# Patient Record
Sex: Female | Born: 2000 | Hispanic: Yes | Marital: Single
Health system: Southern US, Community
[De-identification: ages and names within clinical notes are randomized; demographics above are authoritative.]

---
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
## 2023-02-28 ENCOUNTER — Other Ambulatory Visit
Admission: RE | Admit: 2023-02-28 | Discharge: 2023-02-28 | Disposition: A | Discharge status: Home / Self Care | Payer: BC Managed Care – PPO | Attending: Nurse Practitioner | Admitting: Nurse Practitioner

## 2023-02-28 DIAGNOSIS — E559 Vitamin D deficiency, unspecified: Secondary | ICD-10-CM | POA: Insufficient documentation

## 2023-02-28 DIAGNOSIS — Z0000 Encounter for general adult medical examination without abnormal findings: Secondary | ICD-10-CM | POA: Diagnosis not present

## 2023-02-28 LAB — CBC WITH DIFFERENTIAL/PLATELET
Abs Immature Granulocytes: 0.02 10*3/uL (ref 0.00–0.07)
Basophils Absolute: 0.1 10*3/uL (ref 0.0–0.1)
Basophils Relative: 1 %
Eosinophils Absolute: 0.2 10*3/uL (ref 0.0–0.5)
Eosinophils Relative: 5 %
HCT: 39.7 % (ref 36.0–46.0)
Hemoglobin: 12.8 g/dL (ref 12.0–15.0)
Immature Granulocytes: 0 %
Lymphocytes Relative: 39 %
Lymphs Abs: 2 10*3/uL (ref 0.7–4.0)
MCH: 30.1 pg (ref 26.0–34.0)
MCHC: 32.2 g/dL (ref 30.0–36.0)
MCV: 93.4 fL (ref 80.0–100.0)
Monocytes Absolute: 0.3 10*3/uL (ref 0.1–1.0)
Monocytes Relative: 7 %
Neutro Abs: 2.5 10*3/uL (ref 1.7–7.7)
Neutrophils Relative %: 48 %
Platelets: 207 10*3/uL (ref 150–400)
RBC: 4.25 MIL/uL (ref 3.87–5.11)
RDW: 11.7 % (ref 11.5–15.5)
WBC: 5.1 10*3/uL (ref 4.0–10.5)
nRBC: 0 % (ref 0.0–0.2)

## 2023-02-28 LAB — COMPREHENSIVE METABOLIC PANEL
ALT: 12 U/L (ref 0–44)
AST: 20 U/L (ref 15–41)
Albumin: 4.5 g/dL (ref 3.5–5.0)
Alkaline Phosphatase: 63 U/L (ref 38–126)
Anion gap: 10 (ref 5–15)
BUN: 21 mg/dL — ABNORMAL HIGH (ref 6–20)
CO2: 26 mmol/L (ref 22–32)
Calcium: 9.6 mg/dL (ref 8.9–10.3)
Chloride: 105 mmol/L (ref 98–111)
Creatinine, Ser: 0.55 mg/dL (ref 0.44–1.00)
GFR, Estimated: >60 mL/min (ref >60)
Glucose, Bld: 109 mg/dL — ABNORMAL HIGH (ref 70–99)
Potassium: 4.2 mmol/L (ref 3.5–5.1)
Sodium: 141 mmol/L (ref 135–145)
Total Bilirubin: 0.9 mg/dL (ref 0.3–1.2)
Total Protein: 7.5 g/dL (ref 6.5–8.1)

## 2023-02-28 LAB — VITAMIN D 25 HYDROXY (VIT D DEFICIENCY, FRACTURES): Vit D, 25-Hydroxy: 10.87 ng/mL — ABNORMAL LOW (ref 30–100)

## 2023-02-28 LAB — LIPID PANEL
Cholesterol: 165 mg/dL (ref 0–200)
HDL: 53 mg/dL (ref >40)
LDL Cholesterol: 92 mg/dL (ref 0–99)
Total CHOL/HDL Ratio: 3.1 RATIO
Triglycerides: 98 mg/dL (ref <150)
VLDL: 20 mg/dL (ref 0–40)

## 2023-02-28 LAB — TSH: TSH: 1.159 u[IU]/mL (ref 0.350–4.500)
